# Patient Record
Sex: Male | Born: 1960 | Race: White | Hispanic: No | Marital: Married | Smoking: Never smoker
Health system: Southern US, Community
[De-identification: ages and names within clinical notes are randomized; demographics above are authoritative.]

## PROBLEM LIST (undated history)

## (undated) DIAGNOSIS — J45909 Unspecified asthma, uncomplicated: Secondary | ICD-10-CM

## (undated) DIAGNOSIS — E559 Vitamin D deficiency, unspecified: Secondary | ICD-10-CM

## (undated) DIAGNOSIS — D049 Carcinoma in situ of skin, unspecified: Secondary | ICD-10-CM

## (undated) DIAGNOSIS — K649 Unspecified hemorrhoids: Secondary | ICD-10-CM

## (undated) DIAGNOSIS — E785 Hyperlipidemia, unspecified: Secondary | ICD-10-CM

## (undated) DIAGNOSIS — J301 Allergic rhinitis due to pollen: Secondary | ICD-10-CM

## (undated) DIAGNOSIS — M7740 Metatarsalgia, unspecified foot: Secondary | ICD-10-CM

## (undated) HISTORY — DX: Metatarsalgia, unspecified foot: M77.40

## (undated) HISTORY — DX: Unspecified hemorrhoids: K64.9

## (undated) HISTORY — PX: MOHS SURGERY: SHX181

## (undated) HISTORY — DX: Hyperlipidemia, unspecified: E78.5

## (undated) HISTORY — DX: Vitamin D deficiency, unspecified: E55.9

## (undated) HISTORY — DX: Allergic rhinitis due to pollen: J30.1

## (undated) HISTORY — DX: Carcinoma in situ of skin, unspecified: D04.9

## (undated) HISTORY — PX: HEMORRHOID BANDING: SHX5850

## (undated) HISTORY — DX: Unspecified asthma, uncomplicated: J45.909

---

## 2016-05-05 DIAGNOSIS — Z205 Contact with and (suspected) exposure to viral hepatitis: Secondary | ICD-10-CM | POA: Diagnosis not present

## 2016-05-18 DIAGNOSIS — J343 Hypertrophy of nasal turbinates: Secondary | ICD-10-CM | POA: Diagnosis not present

## 2016-05-18 DIAGNOSIS — R0683 Snoring: Secondary | ICD-10-CM | POA: Diagnosis not present

## 2016-05-18 DIAGNOSIS — J342 Deviated nasal septum: Secondary | ICD-10-CM | POA: Diagnosis not present

## 2016-05-30 DIAGNOSIS — J342 Deviated nasal septum: Secondary | ICD-10-CM | POA: Diagnosis not present

## 2016-05-30 DIAGNOSIS — J343 Hypertrophy of nasal turbinates: Secondary | ICD-10-CM | POA: Diagnosis not present

## 2016-07-15 DIAGNOSIS — M722 Plantar fascial fibromatosis: Secondary | ICD-10-CM | POA: Diagnosis not present

## 2016-09-19 DIAGNOSIS — M722 Plantar fascial fibromatosis: Secondary | ICD-10-CM | POA: Diagnosis not present

## 2017-05-25 ENCOUNTER — Ambulatory Visit (INDEPENDENT_AMBULATORY_CARE_PROVIDER_SITE_OTHER): Payer: BLUE CROSS/BLUE SHIELD | Admitting: Family Medicine

## 2017-05-25 ENCOUNTER — Ambulatory Visit (INDEPENDENT_AMBULATORY_CARE_PROVIDER_SITE_OTHER): Payer: BLUE CROSS/BLUE SHIELD

## 2017-05-25 ENCOUNTER — Encounter: Payer: Self-pay | Admitting: Family Medicine

## 2017-05-25 VITALS — BP 125/78 | HR 65 | Temp 97.3°F | Resp 16 | Ht 72.0 in | Wt 259.2 lb

## 2017-05-25 DIAGNOSIS — M25512 Pain in left shoulder: Secondary | ICD-10-CM | POA: Diagnosis not present

## 2017-05-25 DIAGNOSIS — S46912A Strain of unspecified muscle, fascia and tendon at shoulder and upper arm level, left arm, initial encounter: Secondary | ICD-10-CM | POA: Diagnosis not present

## 2017-05-25 DIAGNOSIS — R079 Chest pain, unspecified: Secondary | ICD-10-CM | POA: Diagnosis not present

## 2017-05-25 MED ORDER — METHOCARBAMOL 500 MG PO TABS: 500.0000 mg | ORAL_TABLET | Freq: Four times a day (QID) | ORAL | 0 refills | Status: DC

## 2017-05-25 MED ORDER — PREDNISONE 20 MG PO TABS: ORAL_TABLET | ORAL | 0 refills | Status: DC

## 2017-05-25 MED ORDER — DICLOFENAC SODIUM 75 MG PO TBEC: 75.0000 mg | DELAYED_RELEASE_TABLET | Freq: Two times a day (BID) | ORAL | 0 refills | Status: DC

## 2017-05-25 MED ORDER — HYDROCODONE-ACETAMINOPHEN 5-325 MG PO TABS: 1.0000 | ORAL_TABLET | Freq: Four times a day (QID) | ORAL | 0 refills | Status: DC | PRN

## 2017-05-25 NOTE — Patient Instructions (Addendum)
Take prednisone 20 mg 3 pills daily for 2 days, then 2 for 2 days, then 1 for 2 days  Take diclofenac 75 mg one twice daily for pain and inflammation. This is a strong medicine in the class of ibuprofen and Aleve so you should not be taking any of them while you are on this medicine.  Take Norco 5 (hydrocodone) one every 6 hours if needed for worse pain, for short-term use only, not to be taken when driving distances.  Do gentle stretching exercises  If the pain continues to persist we may end up wanting to refer you over to a sports medicine doctor.    IF you received an x-ray today, you will receive an invoice from [facility]. Please contact [facility] at [phone] with questions or concerns regarding your invoice.   IF you received labwork today, you will receive an invoice from [facility]. Please contact [facility] at [phone] with questions or concerns regarding your invoice.   Our billing staff will not be able to assist you with questions regarding bills from these companies.  You will be contacted with the lab results as soon as they are available. The fastest way to get your results is to activate your My Chart account. Instructions are located on the last page of this paperwork. If you have not heard from [place] regarding the results in 2 weeks, please contact this office.

## 2017-05-25 NOTE — Progress Notes (Signed)
Patient ID: David Fisher, male    DOB: Feb 14, 1961  Age: 56 y.o. MRN: [id number]  Chief Complaint  Patient presents with  . Neck Pain    left side of shoulder/back radiating down left arm, sharp/intense pain pt has never felt before/  Taking a dozen aleve a day with no relief.    Subjective:   Patient has been playing more golf lately, and over the last couple weeks he has had bad pain in his left shoulder and upper arm. It started when he was golfing. He has taken a lot of OTC nSAIDS, up to a doesn't Aleve a day, without relief. No chest pain or shortness of breath. It is very positional, and if he keeps his left hand up above and behind his head it seems to give it relief. No left hand weakness. He has continued to play golf despite this pain, the swinging of the globe doesn't seem to hurt her a lot. Current allergies, medications, problem list, past/family and social histories reviewed.  Objective:  BP 125/78 (BP Location: Right Arm, Patient Position: Sitting, Cuff Size: Large)   Pulse 65   Temp (!) 97.3 F (36.3 C) (Oral)   Resp 16   Ht 6' (1.829 m)   Wt 259 lb 3.2 oz (117.6 kg)   SpO2 98%   BMI 35.15 kg/m   Neck has good range of motion and is supple and nontender. Left shoulder is nontender. Left upper arm is nontender. Left chest wall is nontender. He is tender just medial and deep to the left scapula.  Assessment & Plan:   Assessment: 1. Left shoulder pain, unspecified chronicity       Plan: Strain scapula area.  Orders Placed This Encounter  Procedures  . DG Chest 2 View    Standing Status:   Future    Number of Occurrences:   1    Standing Expiration Date:   05/25/2018    Order Specific Question:   Reason for Exam (SYMPTOM  OR DIAGNOSIS REQUIRED)    Answer:   left shoulder and arm pain    Order Specific Question:   Preferred imaging location?    Answer:   External  . EKG 12-Lead   EKG is normal. Chest x-ray normal. Meds ordered this encounter  Medications  .  simvastatin (ZOCOR) 10 MG tablet    Sig: Take 10 mg by mouth daily.  Marland Kitchen aspirin EC 81 MG tablet    Sig: Take 81 mg by mouth daily.  . methocarbamol (ROBAXIN) 500 MG tablet    Sig: Take 1 tablet (500 mg total) by mouth 4 (four) times daily.    Dispense:  40 tablet    Refill:  0  . diclofenac (VOLTAREN) 75 MG EC tablet    Sig: Take 1 tablet (75 mg total) by mouth 2 (two) times daily.    Dispense:  30 tablet    Refill:  0  . predniSONE (DELTASONE) 20 MG tablet    Sig: Take 3 daily for 2 days, then 2 daily for 2 days, then 1 daily for 2 days    Dispense:  12 tablet    Refill:  0  . HYDROcodone-acetaminophen (NORCO) 5-325 MG tablet    Sig: Take 1 tablet by mouth every 6 (six) hours as needed.    Dispense:  12 tablet    Refill:  0         Patient Instructions   Take prednisone 20 mg 3 pills daily for 2 days,  then 2 for 2 days, then 1 for 2 days  Take diclofenac 75 mg one twice daily for pain and inflammation. This is a strong medicine in the class of ibuprofen and Aleve so you should not be taking any of them while you are on this medicine.  Take Norco 5 (hydrocodone) one every 6 hours if needed for worse pain, for short-term use only, not to be taken when driving distances.  Do gentle stretching exercises  If the pain continues to persist we may end up wanting to refer you over to a sports medicine doctor.    IF you received an x-ray today, you will receive an invoice from [facility]. Please contact [facility] at [phone] with questions or concerns regarding your invoice.   IF you received labwork today, you will receive an invoice from [facility]. Please contact [facility] at [phone] with questions or concerns regarding your invoice.   Our billing staff will not be able to assist you with questions regarding bills from these companies.  You will be contacted with the lab results as soon as they are available. The fastest way to get your results is to  activate your My Chart account. Instructions are located on the last page of this paperwork. If you have not heard from [place] regarding the results in 2 weeks, please contact this office.      If he calls back for referral to sports medicine please make referral to Vickki Hearing M.D. or Rhina Brackett M.D.  Return if symptoms worsen or fail to improve.   Irmgard Rampersaud, MD 05/25/2017

## 2017-05-29 ENCOUNTER — Other Ambulatory Visit: Payer: Self-pay | Admitting: *Deleted

## 2017-05-29 DIAGNOSIS — M25511 Pain in right shoulder: Secondary | ICD-10-CM

## 2017-05-29 MED ORDER — METAXALONE 800 MG PO TABS: 800.0000 mg | ORAL_TABLET | Freq: Three times a day (TID) | ORAL | 0 refills | Status: DC

## 2017-06-12 ENCOUNTER — Other Ambulatory Visit: Payer: Self-pay | Admitting: Family Medicine

## 2017-06-12 DIAGNOSIS — M4003 Postural kyphosis, cervicothoracic region: Secondary | ICD-10-CM | POA: Diagnosis not present

## 2017-06-12 DIAGNOSIS — M9902 Segmental and somatic dysfunction of thoracic region: Secondary | ICD-10-CM | POA: Diagnosis not present

## 2017-06-12 DIAGNOSIS — M9901 Segmental and somatic dysfunction of cervical region: Secondary | ICD-10-CM | POA: Diagnosis not present

## 2017-06-12 DIAGNOSIS — M50322 Other cervical disc degeneration at C5-C6 level: Secondary | ICD-10-CM | POA: Diagnosis not present

## 2017-06-12 DIAGNOSIS — M25511 Pain in right shoulder: Secondary | ICD-10-CM

## 2017-06-12 DIAGNOSIS — M25512 Pain in left shoulder: Secondary | ICD-10-CM

## 2017-06-12 NOTE — Telephone Encounter (Signed)
Please notify that I have refilled the requested prescriptions. If his symptoms persist, he needs re-evaluation, and/or referral to Sports Medicine (see Dr. Clayborn Heron note).  Meds ordered this encounter  Medications  . diclofenac (VOLTAREN) 75 MG EC tablet    Sig: TAKE 1 TABLET BY MOUTH TWICE A DAY    Dispense:  30 tablet    Refill:  0  . metaxalone (SKELAXIN) 800 MG tablet    Sig: TAKE 1 TABLET BY MOUTH THREE TIMES A DAY    Dispense:  20 tablet    Refill:  0

## 2017-06-14 DIAGNOSIS — M5386 Other specified dorsopathies, lumbar region: Secondary | ICD-10-CM | POA: Diagnosis not present

## 2017-06-14 DIAGNOSIS — M9902 Segmental and somatic dysfunction of thoracic region: Secondary | ICD-10-CM | POA: Diagnosis not present

## 2017-06-14 DIAGNOSIS — M9903 Segmental and somatic dysfunction of lumbar region: Secondary | ICD-10-CM | POA: Diagnosis not present

## 2017-06-14 DIAGNOSIS — M9901 Segmental and somatic dysfunction of cervical region: Secondary | ICD-10-CM | POA: Diagnosis not present

## 2017-06-14 DIAGNOSIS — M50322 Other cervical disc degeneration at C5-C6 level: Secondary | ICD-10-CM | POA: Diagnosis not present

## 2017-06-14 DIAGNOSIS — M4003 Postural kyphosis, cervicothoracic region: Secondary | ICD-10-CM | POA: Diagnosis not present

## 2017-06-15 DIAGNOSIS — M9901 Segmental and somatic dysfunction of cervical region: Secondary | ICD-10-CM | POA: Diagnosis not present

## 2017-06-15 DIAGNOSIS — M5386 Other specified dorsopathies, lumbar region: Secondary | ICD-10-CM | POA: Diagnosis not present

## 2017-06-15 DIAGNOSIS — M50322 Other cervical disc degeneration at C5-C6 level: Secondary | ICD-10-CM | POA: Diagnosis not present

## 2017-06-15 DIAGNOSIS — M9903 Segmental and somatic dysfunction of lumbar region: Secondary | ICD-10-CM | POA: Diagnosis not present

## 2017-06-20 DIAGNOSIS — M9901 Segmental and somatic dysfunction of cervical region: Secondary | ICD-10-CM | POA: Diagnosis not present

## 2017-06-20 DIAGNOSIS — M50322 Other cervical disc degeneration at C5-C6 level: Secondary | ICD-10-CM | POA: Diagnosis not present

## 2017-06-20 DIAGNOSIS — M9903 Segmental and somatic dysfunction of lumbar region: Secondary | ICD-10-CM | POA: Diagnosis not present

## 2017-06-20 DIAGNOSIS — M5386 Other specified dorsopathies, lumbar region: Secondary | ICD-10-CM | POA: Diagnosis not present

## 2017-06-21 DIAGNOSIS — M9903 Segmental and somatic dysfunction of lumbar region: Secondary | ICD-10-CM | POA: Diagnosis not present

## 2017-06-21 DIAGNOSIS — M50322 Other cervical disc degeneration at C5-C6 level: Secondary | ICD-10-CM | POA: Diagnosis not present

## 2017-06-21 DIAGNOSIS — M5386 Other specified dorsopathies, lumbar region: Secondary | ICD-10-CM | POA: Diagnosis not present

## 2017-06-21 DIAGNOSIS — M9901 Segmental and somatic dysfunction of cervical region: Secondary | ICD-10-CM | POA: Diagnosis not present

## 2017-06-27 DIAGNOSIS — M50322 Other cervical disc degeneration at C5-C6 level: Secondary | ICD-10-CM | POA: Diagnosis not present

## 2017-06-27 DIAGNOSIS — M9903 Segmental and somatic dysfunction of lumbar region: Secondary | ICD-10-CM | POA: Diagnosis not present

## 2017-06-27 DIAGNOSIS — M9901 Segmental and somatic dysfunction of cervical region: Secondary | ICD-10-CM | POA: Diagnosis not present

## 2017-06-27 DIAGNOSIS — M5386 Other specified dorsopathies, lumbar region: Secondary | ICD-10-CM | POA: Diagnosis not present

## 2017-06-29 DIAGNOSIS — M5386 Other specified dorsopathies, lumbar region: Secondary | ICD-10-CM | POA: Diagnosis not present

## 2017-06-29 DIAGNOSIS — M9901 Segmental and somatic dysfunction of cervical region: Secondary | ICD-10-CM | POA: Diagnosis not present

## 2017-06-29 DIAGNOSIS — M9903 Segmental and somatic dysfunction of lumbar region: Secondary | ICD-10-CM | POA: Diagnosis not present

## 2017-06-29 DIAGNOSIS — M50322 Other cervical disc degeneration at C5-C6 level: Secondary | ICD-10-CM | POA: Diagnosis not present

## 2017-07-03 DIAGNOSIS — M9903 Segmental and somatic dysfunction of lumbar region: Secondary | ICD-10-CM | POA: Diagnosis not present

## 2017-07-03 DIAGNOSIS — M50322 Other cervical disc degeneration at C5-C6 level: Secondary | ICD-10-CM | POA: Diagnosis not present

## 2017-07-03 DIAGNOSIS — M9901 Segmental and somatic dysfunction of cervical region: Secondary | ICD-10-CM | POA: Diagnosis not present

## 2017-07-03 DIAGNOSIS — M5386 Other specified dorsopathies, lumbar region: Secondary | ICD-10-CM | POA: Diagnosis not present

## 2017-07-05 DIAGNOSIS — M9901 Segmental and somatic dysfunction of cervical region: Secondary | ICD-10-CM | POA: Diagnosis not present

## 2017-07-05 DIAGNOSIS — M50322 Other cervical disc degeneration at C5-C6 level: Secondary | ICD-10-CM | POA: Diagnosis not present

## 2017-07-05 DIAGNOSIS — M9903 Segmental and somatic dysfunction of lumbar region: Secondary | ICD-10-CM | POA: Diagnosis not present

## 2017-07-05 DIAGNOSIS — M5386 Other specified dorsopathies, lumbar region: Secondary | ICD-10-CM | POA: Diagnosis not present

## 2017-07-17 DIAGNOSIS — M9903 Segmental and somatic dysfunction of lumbar region: Secondary | ICD-10-CM | POA: Diagnosis not present

## 2017-07-17 DIAGNOSIS — M50322 Other cervical disc degeneration at C5-C6 level: Secondary | ICD-10-CM | POA: Diagnosis not present

## 2017-07-17 DIAGNOSIS — M9901 Segmental and somatic dysfunction of cervical region: Secondary | ICD-10-CM | POA: Diagnosis not present

## 2017-07-17 DIAGNOSIS — M5386 Other specified dorsopathies, lumbar region: Secondary | ICD-10-CM | POA: Diagnosis not present

## 2017-07-19 DIAGNOSIS — M9901 Segmental and somatic dysfunction of cervical region: Secondary | ICD-10-CM | POA: Diagnosis not present

## 2017-07-19 DIAGNOSIS — M50322 Other cervical disc degeneration at C5-C6 level: Secondary | ICD-10-CM | POA: Diagnosis not present

## 2017-07-19 DIAGNOSIS — M9903 Segmental and somatic dysfunction of lumbar region: Secondary | ICD-10-CM | POA: Diagnosis not present

## 2017-07-19 DIAGNOSIS — M5386 Other specified dorsopathies, lumbar region: Secondary | ICD-10-CM | POA: Diagnosis not present

## 2017-07-24 DIAGNOSIS — M50322 Other cervical disc degeneration at C5-C6 level: Secondary | ICD-10-CM | POA: Diagnosis not present

## 2017-07-24 DIAGNOSIS — M9903 Segmental and somatic dysfunction of lumbar region: Secondary | ICD-10-CM | POA: Diagnosis not present

## 2017-07-24 DIAGNOSIS — M5386 Other specified dorsopathies, lumbar region: Secondary | ICD-10-CM | POA: Diagnosis not present

## 2017-07-24 DIAGNOSIS — M9901 Segmental and somatic dysfunction of cervical region: Secondary | ICD-10-CM | POA: Diagnosis not present

## 2017-08-03 DIAGNOSIS — M9903 Segmental and somatic dysfunction of lumbar region: Secondary | ICD-10-CM | POA: Diagnosis not present

## 2017-08-03 DIAGNOSIS — M5386 Other specified dorsopathies, lumbar region: Secondary | ICD-10-CM | POA: Diagnosis not present

## 2017-08-03 DIAGNOSIS — M50322 Other cervical disc degeneration at C5-C6 level: Secondary | ICD-10-CM | POA: Diagnosis not present

## 2017-08-03 DIAGNOSIS — M9901 Segmental and somatic dysfunction of cervical region: Secondary | ICD-10-CM | POA: Diagnosis not present

## 2017-08-10 DIAGNOSIS — M50322 Other cervical disc degeneration at C5-C6 level: Secondary | ICD-10-CM | POA: Diagnosis not present

## 2017-08-10 DIAGNOSIS — M5386 Other specified dorsopathies, lumbar region: Secondary | ICD-10-CM | POA: Diagnosis not present

## 2017-08-10 DIAGNOSIS — M9903 Segmental and somatic dysfunction of lumbar region: Secondary | ICD-10-CM | POA: Diagnosis not present

## 2017-08-10 DIAGNOSIS — M9901 Segmental and somatic dysfunction of cervical region: Secondary | ICD-10-CM | POA: Diagnosis not present

## 2017-08-14 DIAGNOSIS — M50322 Other cervical disc degeneration at C5-C6 level: Secondary | ICD-10-CM | POA: Diagnosis not present

## 2017-08-14 DIAGNOSIS — M5386 Other specified dorsopathies, lumbar region: Secondary | ICD-10-CM | POA: Diagnosis not present

## 2017-08-14 DIAGNOSIS — M9901 Segmental and somatic dysfunction of cervical region: Secondary | ICD-10-CM | POA: Diagnosis not present

## 2017-08-14 DIAGNOSIS — M9903 Segmental and somatic dysfunction of lumbar region: Secondary | ICD-10-CM | POA: Diagnosis not present

## 2018-01-17 DIAGNOSIS — C44619 Basal cell carcinoma of skin of left upper limb, including shoulder: Secondary | ICD-10-CM | POA: Diagnosis not present

## 2018-01-17 DIAGNOSIS — L814 Other melanin hyperpigmentation: Secondary | ICD-10-CM | POA: Diagnosis not present

## 2018-01-17 DIAGNOSIS — D1801 Hemangioma of skin and subcutaneous tissue: Secondary | ICD-10-CM | POA: Diagnosis not present

## 2018-01-17 DIAGNOSIS — D485 Neoplasm of uncertain behavior of skin: Secondary | ICD-10-CM | POA: Diagnosis not present

## 2018-01-17 DIAGNOSIS — C4441 Basal cell carcinoma of skin of scalp and neck: Secondary | ICD-10-CM | POA: Diagnosis not present

## 2018-01-17 DIAGNOSIS — D229 Melanocytic nevi, unspecified: Secondary | ICD-10-CM | POA: Diagnosis not present

## 2018-01-17 DIAGNOSIS — L821 Other seborrheic keratosis: Secondary | ICD-10-CM | POA: Diagnosis not present

## 2018-01-17 DIAGNOSIS — C44311 Basal cell carcinoma of skin of nose: Secondary | ICD-10-CM | POA: Diagnosis not present

## 2018-03-01 DIAGNOSIS — C44519 Basal cell carcinoma of skin of other part of trunk: Secondary | ICD-10-CM | POA: Diagnosis not present

## 2018-03-01 DIAGNOSIS — C44619 Basal cell carcinoma of skin of left upper limb, including shoulder: Secondary | ICD-10-CM | POA: Diagnosis not present

## 2018-03-01 DIAGNOSIS — C4441 Basal cell carcinoma of skin of scalp and neck: Secondary | ICD-10-CM | POA: Diagnosis not present

## 2018-03-01 DIAGNOSIS — L905 Scar conditions and fibrosis of skin: Secondary | ICD-10-CM | POA: Diagnosis not present

## 2018-05-02 DIAGNOSIS — C44311 Basal cell carcinoma of skin of nose: Secondary | ICD-10-CM | POA: Diagnosis not present

## 2018-06-01 ENCOUNTER — Encounter: Payer: Self-pay | Admitting: Gastroenterology

## 2018-07-23 ENCOUNTER — Encounter (INDEPENDENT_AMBULATORY_CARE_PROVIDER_SITE_OTHER): Payer: Self-pay

## 2018-07-23 ENCOUNTER — Ambulatory Visit: Payer: BLUE CROSS/BLUE SHIELD | Admitting: Gastroenterology

## 2018-07-23 ENCOUNTER — Encounter: Payer: Self-pay | Admitting: Gastroenterology

## 2018-07-23 VITALS — BP 142/82 | HR 72 | Ht 72.0 in | Wt 263.2 lb

## 2018-07-23 DIAGNOSIS — Z1211 Encounter for screening for malignant neoplasm of colon: Secondary | ICD-10-CM | POA: Diagnosis not present

## 2018-07-23 DIAGNOSIS — K625 Hemorrhage of anus and rectum: Secondary | ICD-10-CM | POA: Diagnosis not present

## 2018-07-23 MED ORDER — PEG-KCL-NACL-NASULF-NA ASC-C 140 G PO SOLR: 140.0000 g | ORAL | 0 refills | Status: DC

## 2018-07-23 NOTE — Patient Instructions (Signed)
If you are age 58 or older, your body mass index should be between 23-30. Your Body mass index is 35.7 kg/m. If this is out of the aforementioned range listed, please consider follow up with your Primary Care Provider.  If you are age 61 or younger, your body mass index should be between 19-25. Your Body mass index is 35.7 kg/m. If this is out of the aformentioned range listed, please consider follow up with your Primary Care Provider.   You have been scheduled for a colonoscopy. Please follow written instructions given to you at your visit today.  Please pick up your prep supplies at the pharmacy within the next 1-3 days. If you use inhalers (even only as needed), please bring them with you on the day of your procedure. Your physician has requested that you go to www.startemmi.com and enter the access code given to you at your visit today. This web site gives a general overview about your procedure. However, you should still follow specific instructions given to you by our office regarding your preparation for the procedure.  It was a pleasure to see you today!  Dr. Loletha Carrow

## 2018-07-23 NOTE — Progress Notes (Signed)
[facility] Consult Note:  History: David Fisher 07/23/2018  Referring physician: Waldemar Dickens, MD  Reason for consult/chief complaint: Discuss Colon ([place] colon [place] 8-[place]); Hemorrhoids (flare intermittently. ); and Rectal Bleeding (during hemorrhoid flare)   Subjective  HPI:  He reports a colonoscopy done for rectal bleeding 10 years ago while living and working in [place], and recalls that internal hemorrhoids were found.  He has had intermittent painless bleeding attributed to these hemorrhoids since then, occurring every few weeks to a couple of months.  He denies chronic abdominal pain, change in bowel habits, nausea, vomiting, frequent heartburn, dysphagia or weight loss.  He recalls having had one in the hemorrhoids "cauterized" many years ago, no surgical therapy or banding has been done.   ROS:  Review of Systems  Constitutional: Negative for appetite change and unexpected weight change.  HENT: Negative for mouth sores and voice change.   Eyes: Negative for pain and redness.  Respiratory: Negative for cough and shortness of breath.   Cardiovascular: Negative for chest pain and palpitations.  Genitourinary: Negative for dysuria and hematuria.  Musculoskeletal: Negative for arthralgias and myalgias.  Skin: Negative for pallor and rash.  Allergic/Immunologic: Positive for environmental allergies.  Neurological: Negative for weakness and headaches.  Hematological: Negative for adenopathy.     Past Medical History: Past Medical History:  Diagnosis Date  . Asthma   . Basal cell carcinoma (BCC) in situ of skin 2013,2019   nose  . Hay fever   . Hemorrhoids   . Hyperlipidemia   . Metatarsalgia   . Vitamin D deficiency      Past Surgical History: Past Surgical History:  Procedure Laterality Date  . MOHS SURGERY     BCC Nose     Family History: Family History  Problem Relation Age of Onset  . Skin cancer Father      Social History: Social History   Socioeconomic History  . Marital status: Married    Spouse name: Not on file  . Number of children: 3  . Years of education: Not on file  . Highest education level: Not on file  Occupational History  . Not on file  Social Needs  . Financial resource strain: Not on file  . Food insecurity:    Worry: Not on file    Inability: Not on file  . Transportation needs:    Medical: Not on file    Non-medical: Not on file  Tobacco Use  . Smoking status: Never Smoker  . Smokeless tobacco: Never Used  Substance and Sexual Activity  . Alcohol use: Yes    Alcohol/week: 2.0 standard drinks    Types: 1 Glasses of wine, 1 Cans of beer per week    Comment: social  . Drug use: No  . Sexual activity: Yes    Partners: Female  Lifestyle  . Physical activity:    Days per week: Not on file    Minutes per session: Not on file  . Stress: Not on file  Relationships  . Social connections:    Talks on phone: Not on file    Gets together: Not on file    Attends religious service: Not on file    Active member of club or organization: Not on file    Attends meetings of clubs or organizations: Not on file    Relationship status: Not on file  Other Topics Concern  . Not on file  Social History Narrative  . 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 aspirin EC 81 MG tablet Take 81 mg by mouth daily.    . cetirizine (ZYRTEC) 10 MG tablet Take 10 mg by mouth daily.    . Multiple Vitamin (MULTIVITAMIN) tablet Take 1 tablet by mouth daily.    . simvastatin (ZOCOR) 10 MG tablet Take 10 mg by mouth daily.    Marland Kitchen PEG-KCl-NaCl-NaSulf-Na Asc-C (PLENVU) 140 g SOLR Take 140 g by mouth as directed. 1 each 0   No current facility-administered medications for this visit.        ___________________________________________________________________ Objective   Exam:  BP (!) 142/82   Pulse 72   Ht 6' (1.829 m)   Wt 263 lb 4 oz (119.4 kg)   BMI 35.70 kg/m    General: this is a(n) well-appearing man  Eyes: sclera anicteric, no redness  ENT: oral mucosa moist without lesions, no cervical or supraclavicular lymphadenopathy, good dentition  CV: RRR without murmur, S1/S2, no JVD, no peripheral edema  Resp: clear to auscultation bilaterally, normal RR and effort noted  GI: soft, no tenderness, with active bowel sounds. No guarding or palpable organomegaly noted.  Skin; warm and dry, no rash or jaundice noted  Neuro: awake, alert and oriented x 3. Normal gross motor function and fluent speech  Labs: Primary care records include normal creatinine and hemoglobin on 05/08/2018  Assessment: Encounter Diagnoses  Name Primary?  . Special screening for malignant neoplasms, colon Yes  . Rectal bleeding     He is due for screening colonoscopy.  His hemorrhoidal bleeding seems to need treatment.  We had an initial discussion about hemorrhoidal banding, I showed him diagrams and informational booklet.  He is interested, and we set him up for a follow-up office visit after the colonoscopy for probable banding session.   Thank you for the courtesy of this consult.  Please call me with any questions or concerns.  Nelida Meuse III  CC: Waldemar Dickens, MD

## 2018-08-17 HISTORY — PX: COLONOSCOPY: SHX174

## 2018-09-03 ENCOUNTER — Other Ambulatory Visit: Payer: Self-pay

## 2018-09-03 ENCOUNTER — Telehealth: Payer: Self-pay | Admitting: Gastroenterology

## 2018-09-03 MED ORDER — PEG-KCL-NACL-NASULF-NA ASC-C 140 G PO SOLR: 140.0000 g | ORAL | 0 refills | Status: DC

## 2018-09-03 NOTE — Telephone Encounter (Signed)
A pay no more than $ 50 coupon has been faxed to the patients pharmacy.

## 2018-09-05 ENCOUNTER — Encounter: Payer: Self-pay | Admitting: Gastroenterology

## 2018-09-05 ENCOUNTER — Encounter: Payer: BLUE CROSS/BLUE SHIELD | Admitting: Gastroenterology

## 2018-09-05 ENCOUNTER — Ambulatory Visit (AMBULATORY_SURGERY_CENTER): Payer: BLUE CROSS/BLUE SHIELD | Admitting: Gastroenterology

## 2018-09-05 VITALS — BP 132/69 | HR 56 | Temp 97.8°F | Resp 13 | Ht 72.0 in | Wt 263.0 lb

## 2018-09-05 DIAGNOSIS — D125 Benign neoplasm of sigmoid colon: Secondary | ICD-10-CM | POA: Diagnosis not present

## 2018-09-05 DIAGNOSIS — Z1211 Encounter for screening for malignant neoplasm of colon: Secondary | ICD-10-CM

## 2018-09-05 DIAGNOSIS — D123 Benign neoplasm of transverse colon: Secondary | ICD-10-CM | POA: Diagnosis not present

## 2018-09-05 MED ORDER — SODIUM CHLORIDE 0.9 % IV SOLN: 500.0000 mL | Freq: Once | INTRAVENOUS | Status: DC

## 2018-09-05 NOTE — Progress Notes (Signed)
Called to room to assist during endoscopic procedure.  Patient ID and intended procedure confirmed with present staff. Received instructions for my participation in the procedure from the performing physician.  

## 2018-09-05 NOTE — Patient Instructions (Signed)
Handouts:  Polyps, and banding info.  YOU HAD AN ENDOSCOPIC PROCEDURE TODAY AT [facility]:   Refer to the procedure report that was given to you for any specific questions about what was found during the examination.  If the procedure report does not answer your questions, please call your gastroenterologist to clarify.  If you requested that your care partner not be given the details of your procedure findings, then the procedure report has been included in a sealed envelope for you to review at your convenience later.  YOU SHOULD EXPECT: Some feelings of bloating in the abdomen. Passage of more gas than usual.  Walking can help get rid of the air that was put into your GI tract during the procedure and reduce the bloating. If you had a lower endoscopy (such as a colonoscopy or flexible sigmoidoscopy) you may notice spotting of blood in your stool or on the toilet paper. If you underwent a bowel prep for your procedure, you may not have a normal bowel movement for a few days.  Please Note:  You might notice some irritation and congestion in your nose or some drainage.  This is from the oxygen used during your procedure.  There is no need for concern and it should clear up in a day or so.  SYMPTOMS TO REPORT IMMEDIATELY:   Following lower endoscopy (colonoscopy or flexible sigmoidoscopy):  Excessive amounts of blood in the stool  Significant tenderness or worsening of abdominal pains  Swelling of the abdomen that is new, acute  Fever of 100F or higher  For urgent or emergent issues, a gastroenterologist can be reached at any hour by calling [phone].   DIET:  We do recommend a small meal at first, but then you may proceed to your regular diet.  Drink plenty of fluids but you should avoid alcoholic beverages for 24 hours.  ACTIVITY:  You should plan to take it easy for the rest of today and you should NOT DRIVE or use heavy machinery until tomorrow (because of the  sedation medicines used during the test).    FOLLOW UP: Our staff will call the number listed on your records the next business day following your procedure to check on you and address any questions or concerns that you may have regarding the information given to you following your procedure. If we do not reach you, we will leave a message.  However, if you are feeling well and you are not experiencing any problems, there is no need to return our call.  We will assume that you have returned to your regular daily activities without incident.  If any biopsies were taken you will be contacted by phone or by letter within the next 1-3 weeks.  Please call us at [phone] if you have not heard about the biopsies in 3 weeks.    SIGNATURES/CONFIDENTIALITY: You and/or your care partner have signed paperwork which will be entered into your electronic medical record.  These signatures attest to the fact that that the information above on your After Visit Summary has been reviewed and is understood.  Full responsibility of the confidentiality of this discharge information lies with you and/or your care-partner.

## 2018-09-05 NOTE — Op Note (Addendum)
[place] Patient Name: David Fisher Procedure Date: 09/05/2018 1:33 PM MRN: [id number] Endoscopist: Mallie Mussel L. Loletha Carrow , MD Age: 57 Referring MD:  Date of Birth: 07-10-1961 Gender: Male Account #: [id number] Procedure:                Colonoscopy Indications:              Screening for colorectal malignant neoplasm, , This                            is the patient's first screening colonoscopy                            (patient reports a diagnostic colonoscopy 10 years                            ago done for rectal bleeding) Medicines:                Monitored Anesthesia Care Procedure:                Pre-Anesthesia Assessment:                           - Prior to the procedure, a History and Physical                            was performed, and patient medications and                            allergies were reviewed. The patient's tolerance of                            previous anesthesia was also reviewed. The risks                            and benefits of the procedure and the sedation                            options and risks were discussed with the patient.                            All questions were answered, and informed consent                            was obtained. Anticoagulants: The patient has taken                            aspirin. It was decided not to withhold this                            medication prior to the procedure. ASA Grade                            Assessment: II - A patient with mild systemic  disease. After reviewing the risks and benefits,                            the patient was deemed in satisfactory condition to                            undergo the procedure.                           After obtaining informed consent, the colonoscope                            was passed under direct vision. Throughout the                            procedure, the patient's blood pressure, pulse, and                oxygen saturations were monitored continuously. The                            Colonoscope was introduced through the anus and                            advanced to the the cecum, identified by                            appendiceal orifice and ileocecal valve. The                            colonoscopy was performed without difficulty. The                            patient tolerated the procedure well. The quality                            of the bowel preparation was good. The ileocecal                            valve, appendiceal orifice, and rectum were                            photographed. Scope In: 1:42:08 PM Scope Out: 1:59:48 PM Scope Withdrawal Time: 0 hours 13 minutes 0 seconds  Total Procedure Duration: 0 hours 17 minutes 40 seconds  Findings:                 The perianal and digital rectal examinations were                            normal.                           A 6 mm polyp was found in the transverse colon. The  polyp was sessile. The polyp was removed with a                            cold snare. Resection and retrieval were complete.                           A 8 mm polyp was found in the distal sigmoid colon.                            The polyp was semi-pedunculated. The polyp was                            removed with a hot snare. Resection and retrieval                            were complete.                           Internal hemorrhoids were found.                           The exam was otherwise without abnormality on                            direct and retroflexion views. Complications:            No immediate complications. Estimated Blood Loss:     Estimated blood loss was minimal. Impression:               - One 6 mm polyp in the transverse colon, removed                            with a cold snare. Resected and retrieved.                           - One 8 mm polyp in the distal sigmoid colon,                             removed with a hot snare. Resected and retrieved.                           - Internal hemorrhoids.                           - The examination was otherwise normal on direct                            and retroflexion views. Recommendation:           - Patient has a contact number available for                            emergencies. The signs and symptoms of potential  delayed complications were discussed with the                            patient. Return to normal activities tomorrow.                            Written discharge instructions were provided to the                            patient.                           - Resume previous diet.                           - Continue present medications.                           - Await pathology results.                           - Repeat colonoscopy is recommended for                            surveillance. The colonoscopy date will be                            determined after pathology results from today's                            exam become available for review.                           - Return to my office as needed if hemorrhoidal                            banding desired. [name] L. Loletha Carrow, MD 09/05/2018 2:08:12 PM This report has been signed electronically.

## 2018-09-05 NOTE — Progress Notes (Signed)
PT taken to PACU. Monitors in place. VSS. Report given to RN. 

## 2018-09-06 ENCOUNTER — Telehealth: Payer: Self-pay | Admitting: *Deleted

## 2018-09-06 NOTE — Telephone Encounter (Signed)
  Follow up Call-  Call back number 09/05/2018  Post procedure Call Back phone  # [phone]  Permission to leave phone message Yes     Patient questions:  Do you have a fever, pain , or abdominal swelling? No. Pain Score  0 *  Have you tolerated food without any problems? Yes.    Have you been able to return to your normal activities? Yes.    Do you have any questions about your discharge instructions: Diet   No. Medications  No. Follow up visit  No.  Do you have questions or concerns about your Care? No.  Actions: * If pain score is 4 or above: No action needed, pain <4.

## 2018-09-10 ENCOUNTER — Ambulatory Visit: Payer: BLUE CROSS/BLUE SHIELD | Admitting: Gastroenterology

## 2018-09-10 ENCOUNTER — Encounter: Payer: Self-pay | Admitting: Gastroenterology

## 2018-09-10 VITALS — BP 138/74 | HR 86 | Ht 72.0 in | Wt 263.6 lb

## 2018-09-10 DIAGNOSIS — K648 Other hemorrhoids: Secondary | ICD-10-CM | POA: Diagnosis not present

## 2018-09-10 NOTE — Patient Instructions (Signed)
If you are age 58 or older, your body mass index should be between 23-30. Your Body mass index is 35.75 kg/m. If this is out of the aforementioned range listed, please consider follow up with your Primary Care Provider.  If you are age 24 or younger, your body mass index should be between 19-25. Your Body mass index is 35.75 kg/m. If this is out of the aformentioned range listed, please consider follow up with your Primary Care Provider.   HEMORRHOID BANDING PROCEDURE    FOLLOW-UP CARE   1. The procedure you have had should have been relatively painless since the banding of the area involved does not have nerve endings and there is no pain sensation.  The rubber band cuts off the blood supply to the hemorrhoid and the band may fall off as soon as 48 hours after the banding (the band may occasionally be seen in the toilet bowl following a bowel movement). You may notice a temporary feeling of fullness in the rectum which should respond adequately to plain Tylenol or Motrin.  2. Following the banding, avoid strenuous exercise that evening and resume full activity the next day.  A sitz bath (soaking in a warm tub) or bidet is soothing, and can be useful for cleansing the area after bowel movements.     3. To avoid constipation, take two tablespoons of natural wheat bran, natural oat bran, flax, Benefiber or any over the counter fiber supplement and increase your water intake to 7-8 glasses daily.    4. Unless you have been prescribed anorectal medication, do not put anything inside your rectum for two weeks: No suppositories, enemas, fingers, etc.  5. Occasionally, you may have more bleeding than usual after the banding procedure.  This is often from the untreated hemorrhoids rather than the treated one.  Don't be concerned if there is a tablespoon or so of blood.  If there is more blood than this, lie flat with your bottom higher than your head and apply an ice pack to the area. If the bleeding  does not stop within a half an hour or if you feel faint, call our office at [phone] or go to the emergency room.  6. Problems are not common; however, if there is a substantial amount of bleeding, severe pain, chills, fever or difficulty passing urine (very rare) or other problems, you should call us at [phone] or report to the nearest emergency room.  7. Do not stay seated continuously for more than 2-3 hours for a day or two after the procedure.  Tighten your buttock muscles 10-15 times every two hours and take 10-15 deep breaths every 1-2 hours.  Do not spend more than a few minutes on the toilet if you cannot empty your bowel; instead re-visit the toilet at a later time.    It was a pleasure to see you today!  Dr. Loletha Carrow

## 2018-09-10 NOTE — Progress Notes (Signed)
PROCEDURE NOTE: The patient presents with symptomatic grade 2 hemorrhoids, requesting rubber band ligation of his/her hemorrhoidal disease. All risks, benefits and alternative forms of therapy were described and informed consent was obtained.  DRE revealed: prolapsed RP HR   The anorectum was pre-medicated with 0.125% NTG and lubricant. The decision was made to band the RP internal hemorrhoids, and the [place] was used to perform band ligation without complication.Initial band too little tissue - removed and second banding done with good result. Digital anorectal examination was then performed to assure proper positioning of the band, and to adjust the banded tissue as required. The patient was discharged home without pain or other issues. Dietary and behavioral recommendations were given and along with follow-up instructions.   The following adjunctive treatments were recommended:  none  The patient will return 2-3 weeks  for follow-up and possible additional banding as required. No complications were encountered and the patient tolerated the procedure well.

## 2018-09-12 ENCOUNTER — Encounter: Payer: Self-pay | Admitting: Gastroenterology

## 2018-10-02 ENCOUNTER — Ambulatory Visit: Payer: BLUE CROSS/BLUE SHIELD | Admitting: Gastroenterology

## 2018-10-02 ENCOUNTER — Encounter: Payer: Self-pay | Admitting: Gastroenterology

## 2018-10-02 DIAGNOSIS — K648 Other hemorrhoids: Secondary | ICD-10-CM

## 2018-10-02 NOTE — Patient Instructions (Signed)
If you are age 58 or older, your body mass index should be between 23-30. Your Body mass index is 36.62 kg/m. If this is out of the aforementioned range listed, please consider follow up with your Primary Care Provider.  If you are age 76 or younger, your body mass index should be between 19-25. Your Body mass index is 36.62 kg/m. If this is out of the aformentioned range listed, please consider follow up with your Primary Care Provider.   HEMORRHOID BANDING PROCEDURE    FOLLOW-UP CARE   1. The procedure you have had should have been relatively painless since the banding of the area involved does not have nerve endings and there is no pain sensation.  The rubber band cuts off the blood supply to the hemorrhoid and the band may fall off as soon as 48 hours after the banding (the band may occasionally be seen in the toilet bowl following a bowel movement). You may notice a temporary feeling of fullness in the rectum which should respond adequately to plain Tylenol or Motrin.  2. Following the banding, avoid strenuous exercise that evening and resume full activity the next day.  A sitz bath (soaking in a warm tub) or bidet is soothing, and can be useful for cleansing the area after bowel movements.     3. To avoid constipation, take two tablespoons of natural wheat bran, natural oat bran, flax, Benefiber or any over the counter fiber supplement and increase your water intake to 7-8 glasses daily.    4. Unless you have been prescribed anorectal medication, do not put anything inside your rectum for two weeks: No suppositories, enemas, fingers, etc.  5. Occasionally, you may have more bleeding than usual after the banding procedure.  This is often from the untreated hemorrhoids rather than the treated one.  Don't be concerned if there is a tablespoon or so of blood.  If there is more blood than this, lie flat with your bottom higher than your head and apply an ice pack to the area. If the bleeding  does not stop within a half an hour or if you feel faint, call our office at [phone] or go to the emergency room.  6. Problems are not common; however, if there is a substantial amount of bleeding, severe pain, chills, fever or difficulty passing urine (very rare) or other problems, you should call us at [phone] or report to the nearest emergency room.  7. Do not stay seated continuously for more than 2-3 hours for a day or two after the procedure.  Tighten your buttock muscles 10-15 times every two hours and take 10-15 deep breaths every 1-2 hours.  Do not spend more than a few minutes on the toilet if you cannot empty your bowel; instead re-visit the toilet at a later time.    It was a pleasure to see you today!  Dr. Loletha Carrow

## 2018-10-02 NOTE — Progress Notes (Signed)
PROCEDURE NOTE: The patient presents with symptomatic grade 2 hemorrhoids, requesting rubber band ligation of his/her hemorrhoidal disease. All risks, benefits and alternative forms of therapy were described and informed consent was obtained.  DRE revealed: posterior hypertrophied anal papilla   The anorectum was pre-medicated with 0.125% NTG and lubricant. The decision was made to band the RA internal hemorrhoids, and the [place] was used to perform band ligation without complication. Digital anorectal examination was then performed to assure proper positioning of the band, and to adjust the banded tissue as required. The patient was discharged home without pain or other issues. Dietary and behavioral recommendations were given and along with follow-up instructions.   The following adjunctive treatments were recommended:  none  The patient will return 3 weeks  for follow-up and possible additional banding as required. No complications were encountered and the patient tolerated the procedure well.

## 2018-10-29 ENCOUNTER — Encounter: Payer: BLUE CROSS/BLUE SHIELD | Admitting: Gastroenterology

## 2018-11-09 ENCOUNTER — Ambulatory Visit: Payer: BLUE CROSS/BLUE SHIELD | Admitting: Gastroenterology

## 2018-11-09 ENCOUNTER — Encounter: Payer: Self-pay | Admitting: Gastroenterology

## 2018-11-09 DIAGNOSIS — K648 Other hemorrhoids: Secondary | ICD-10-CM

## 2018-11-09 NOTE — Progress Notes (Signed)
PROCEDURE NOTE: The patient presents with symptomatic grade 2 hemorrhoids, requesting rubber band ligation of his/her hemorrhoidal disease. All risks, benefits and alternative forms of therapy were described and informed consent was obtained.  DRE revealed: hypertrophied anal papilla as before   The anorectum was pre-medicated with 0.125% NTG and lubricant. The decision was made to band the LL internal hemorrhoids, and the [place] was used to perform band ligation without complication. Digital anorectal examination was then performed to assure proper positioning of the band, and to adjust the banded tissue as required. The patient was discharged home without pain or other issues. Dietary and behavioral recommendations were given and along with follow-up instructions.   The following adjunctive treatments were recommended:  none  The patient will return as needed  for follow-up and possible additional banding as required. No complications were encountered and the patient tolerated the procedure well.

## 2018-11-09 NOTE — Patient Instructions (Signed)
If you are age 59 or older, your body mass index should be between 23-30. Your Body mass index is 36.08 kg/m. If this is out of the aforementioned range listed, please consider follow up with your Primary Care Provider.  If you are age 43 or younger, your body mass index should be between 19-25. Your Body mass index is 36.08 kg/m. If this is out of the aformentioned range listed, please consider follow up with your Primary Care Provider.   It was a pleasure to see you today!  Dr. Loletha Carrow

## 2020-06-02 ENCOUNTER — Other Ambulatory Visit: Payer: Self-pay | Admitting: Family Medicine

## 2020-06-02 DIAGNOSIS — R0609 Other forms of dyspnea: Secondary | ICD-10-CM

## 2020-06-19 ENCOUNTER — Other Ambulatory Visit (HOSPITAL_COMMUNITY): Payer: BLUE CROSS/BLUE SHIELD

## 2020-07-21 ENCOUNTER — Other Ambulatory Visit: Payer: Self-pay

## 2020-07-21 ENCOUNTER — Ambulatory Visit (HOSPITAL_COMMUNITY): Payer: BC Managed Care – PPO | Attending: Cardiology

## 2020-07-21 DIAGNOSIS — R0600 Dyspnea, unspecified: Secondary | ICD-10-CM | POA: Diagnosis not present

## 2020-07-21 DIAGNOSIS — R0609 Other forms of dyspnea: Secondary | ICD-10-CM

## 2020-07-21 LAB — ECHOCARDIOGRAM COMPLETE
Area-P 1/2: 3.65 cm2
S' Lateral: 2.7 cm

## 2020-11-18 ENCOUNTER — Other Ambulatory Visit (HOSPITAL_COMMUNITY): Payer: Self-pay | Admitting: Internal Medicine

## 2020-11-18 DIAGNOSIS — I5189 Other ill-defined heart diseases: Secondary | ICD-10-CM

## 2020-12-02 ENCOUNTER — Ambulatory Visit (HOSPITAL_COMMUNITY): Payer: BC Managed Care – PPO

## 2020-12-16 ENCOUNTER — Ambulatory Visit (HOSPITAL_COMMUNITY)
Admission: RE | Admit: 2020-12-16 | Discharge: 2020-12-16 | Disposition: A | Discharge status: Home / Self Care | Payer: Self-pay | Source: Ambulatory Visit | Attending: Internal Medicine | Admitting: Internal Medicine

## 2020-12-16 ENCOUNTER — Other Ambulatory Visit: Payer: Self-pay

## 2020-12-16 DIAGNOSIS — I5189 Other ill-defined heart diseases: Secondary | ICD-10-CM | POA: Insufficient documentation

## 2020-12-16 DIAGNOSIS — I700 Atherosclerosis of aorta: Secondary | ICD-10-CM

## 2021-09-06 IMAGING — CT CT CARDIAC CORONARY ARTERY CALCIUM SCORE
2 series · 16 of 20 positions shown, 18 images · non-contrast
Comparison: 05/25/2017 chest radiograph.
COMPARISON: 05/25/2017 chest radiograph.

Addendum:
EXAM:
OVER-READ INTERPRETATION  CT CHEST

The following report is an over-read performed by radiologist Dr.
Polin Billiot [REDACTED] on 12/16/2020. This over-read
does not include interpretation of cardiac or coronary anatomy or
pathology. The coronary calcium score/coronary CTA interpretation by
the cardiologist is attached.
CLINICAL DATA: Risk stratification: 59 Year-old White Male
Coronary Calcium Score
TECHNIQUE: The patient was scanned on a Siemens Force scanner. Axial
non-contrast 3 mm slices were carried out through the heart. The
data set was analyzed on a dedicated work station and scored using
the Agatson method.

[Series 3: cascseq 2.0 b35f 70% · axial · 0.39mm/px · z∈[+1053,+1159]mm · 8 of 69 slices shown]
[im 8/69  vessel]
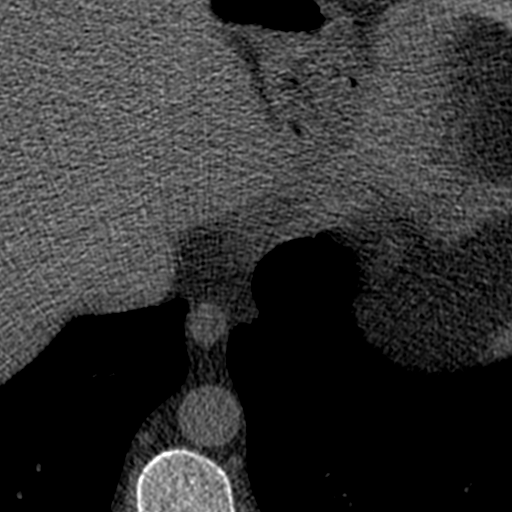
[im 16/69  vessel]
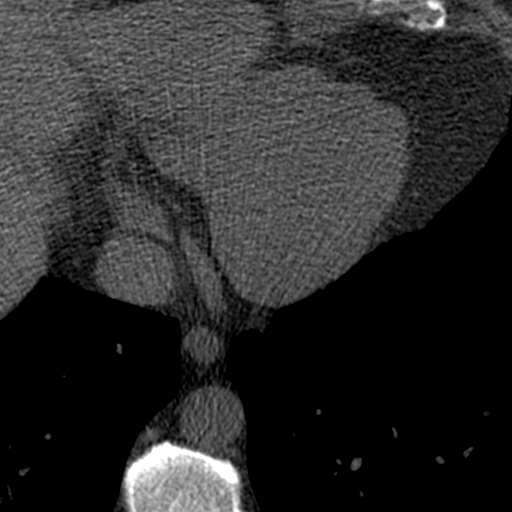
[im 23/69  vessel]
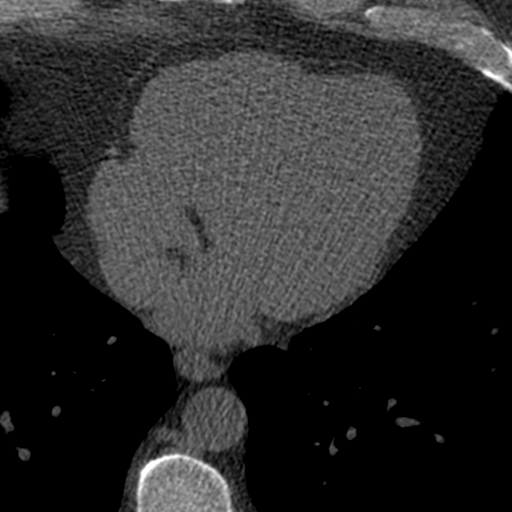
[im 31/69  vessel]
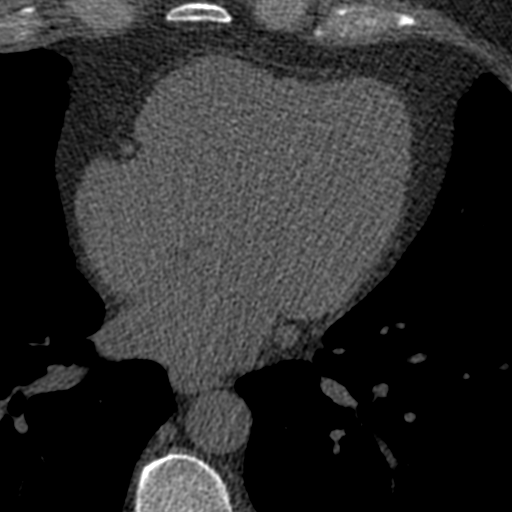
[im 38/69  vessel]
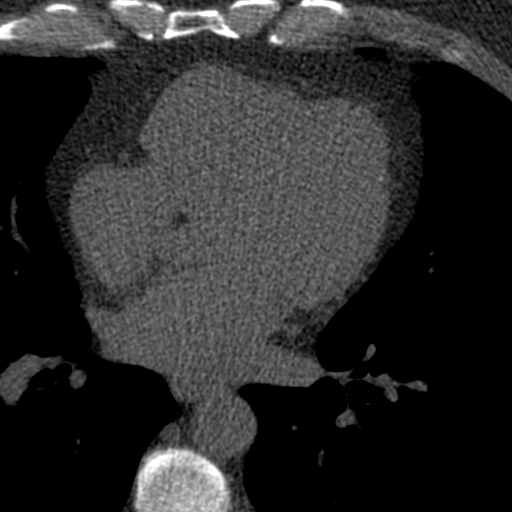
[im 46/69  vessel]
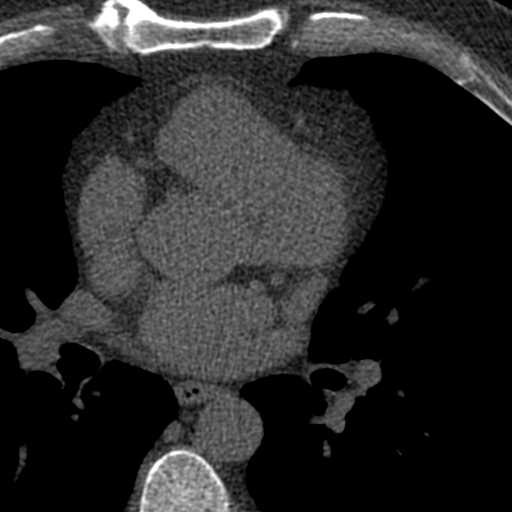
[im 53/69  vessel]
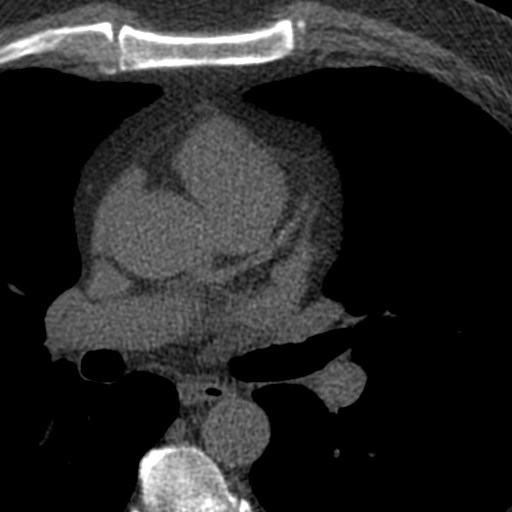
[im 61/69  vessel]
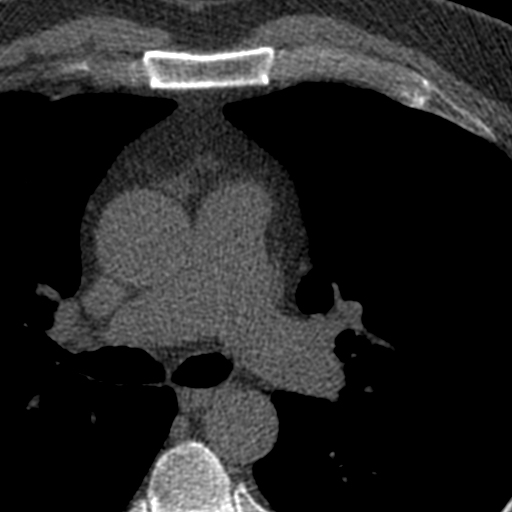

[Series 4: ax st full fov · axial · 0.80mm/px · z∈[+1053,+1159]mm · 8 of 69 slices shown, 10 images]
[im 8/69  vessel]
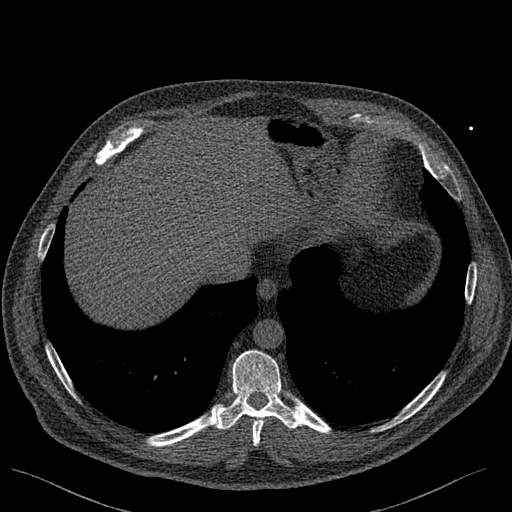
[im 8/69  lung]
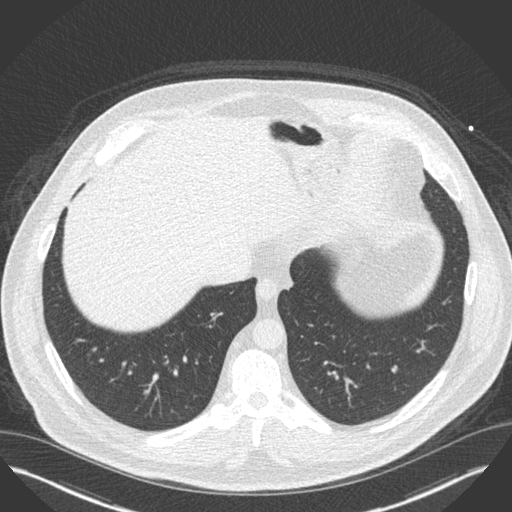
[im 16/69  vessel]
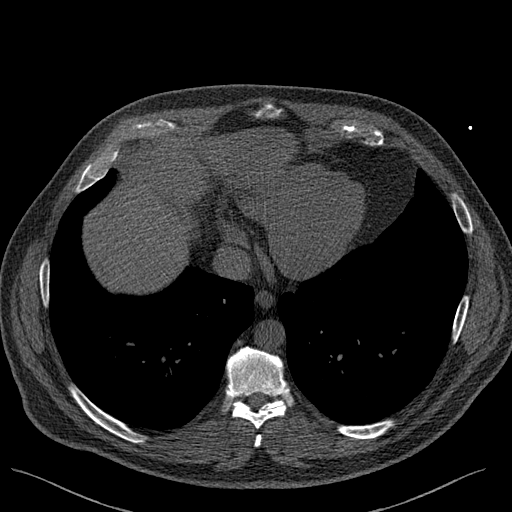
[im 23/69  vessel]
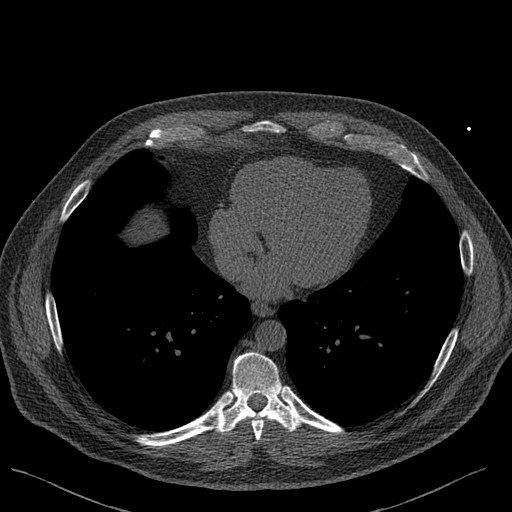
[im 31/69  vessel]
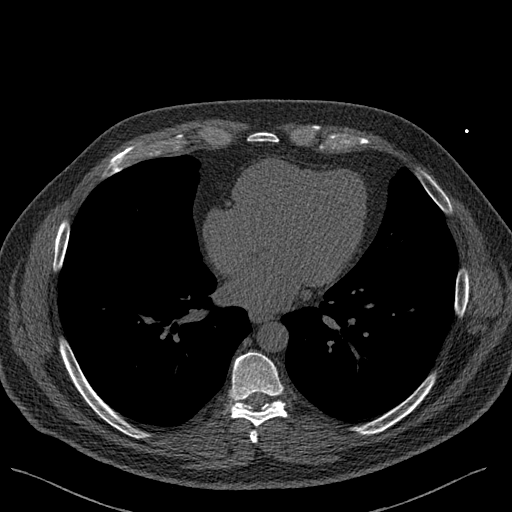
[im 38/69  vessel]
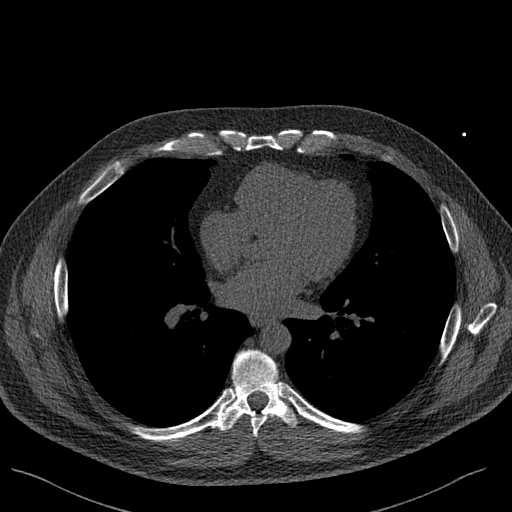
[im 38/69  lung]
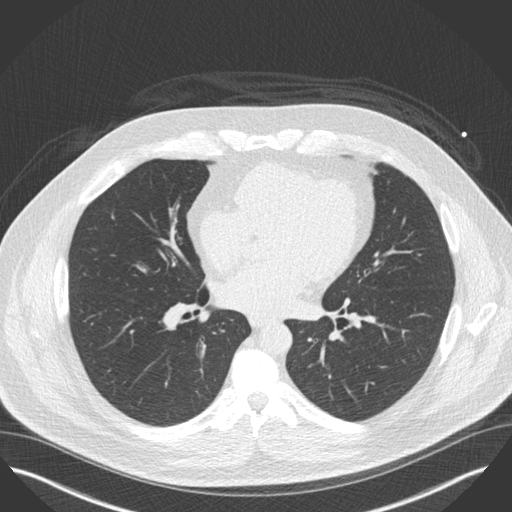
[im 46/69  vessel]
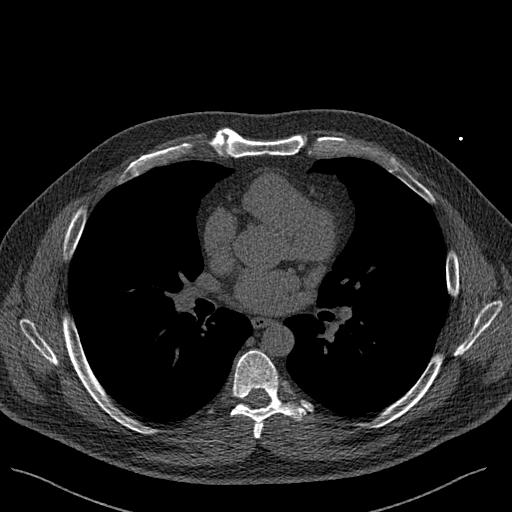
[im 53/69  vessel]
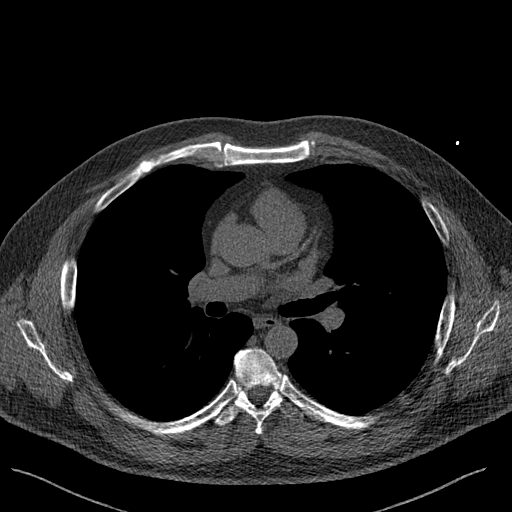
[im 61/69  vessel]
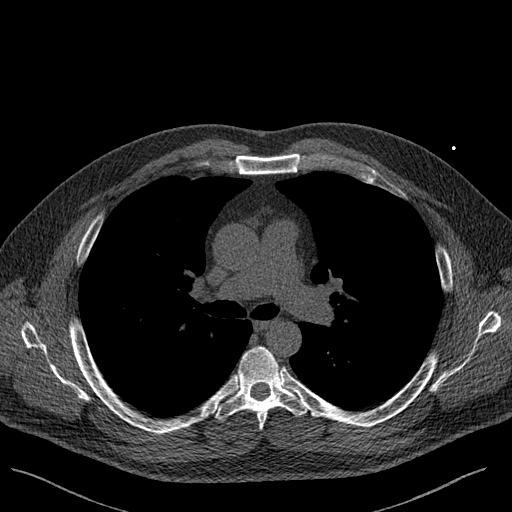

[16 of 20 positions shown; findings below may reference images not displayed]

FINDINGS: Vascular: Aortic atherosclerosis.

Mediastinum/Nodes: No imaged thoracic adenopathy.

Lungs/Pleura: No pleural fluid. Nonspecific mild bronchial wall
thickening.

Upper Abdomen: Normal imaged portions of the liver, spleen, stomach.

Musculoskeletal: No acute osseous abnormality.
IMPRESSION: 1.  No acute findings in the imaged extracardiac chest.
2.  Aortic Atherosclerosis (N5L13-R5D.D).
FINDINGS: Non-cardiac: See separate report from [REDACTED].

Ascending Aorta: Normal caliber.

Aortic arch atherosclerosis noted.

Pericardium: Normal.

Coronary arteries: Normal origins.

Coronary calcium score of 7. This was 37th percentile for age,
gender, and race matched controls.
IMPRESSION: 1. Coronary calcium score of 7. This was 37th percentile for age,
gender, and race matched controls.

2. Aortic arch atherosclerosis noted.

*** End of Addendum ***
EXAM:
OVER-READ INTERPRETATION  CT CHEST

The following report is an over-read performed by radiologist Dr.
Polin Billiot [REDACTED] on 12/16/2020. This over-read
does not include interpretation of cardiac or coronary anatomy or
pathology. The coronary calcium score/coronary CTA interpretation by
the cardiologist is attached.
FINDINGS: Vascular: Aortic atherosclerosis.

Mediastinum/Nodes: No imaged thoracic adenopathy.

Lungs/Pleura: No pleural fluid. Nonspecific mild bronchial wall
thickening.

Upper Abdomen: Normal imaged portions of the liver, spleen, stomach.

Musculoskeletal: No acute osseous abnormality.
IMPRESSION: 1.  No acute findings in the imaged extracardiac chest.
2.  Aortic Atherosclerosis (N5L13-R5D.D).

## 2023-09-06 ENCOUNTER — Encounter: Payer: Self-pay | Admitting: Gastroenterology

## 2023-12-11 ENCOUNTER — Encounter: Payer: Self-pay | Admitting: Gastroenterology

## 2024-01-08 ENCOUNTER — Ambulatory Visit (AMBULATORY_SURGERY_CENTER): Payer: 59

## 2024-01-08 VITALS — Ht 72.0 in | Wt 245.0 lb

## 2024-01-08 DIAGNOSIS — Z860100 Personal history of colon polyps, unspecified: Secondary | ICD-10-CM

## 2024-01-08 MED ORDER — NA SULFATE-K SULFATE-MG SULF 17.5-3.13-1.6 GM/177ML PO SOLN: 1.0000 | Freq: Once | ORAL | 0 refills | Status: AC

## 2024-01-08 NOTE — Progress Notes (Signed)

## 2024-01-08 NOTE — Patient Instructions (Signed)
 [facility] has implemented a new process for scheduling procedures.  Please note your arrival time for the [facility] is your appointment time that is shown on your written instructions.  Please do not arrive one hour prior to the time listed in your instructions.  Please ignore any outside notifications to arrive one hour early.  We apologize for any confusion and look forward to seeing you for your procedure.

## 2024-01-30 ENCOUNTER — Encounter: Payer: Self-pay | Admitting: Gastroenterology

## 2024-02-08 ENCOUNTER — Other Ambulatory Visit: Payer: Self-pay | Admitting: Medical Genetics

## 2024-02-14 ENCOUNTER — Other Ambulatory Visit (HOSPITAL_COMMUNITY)
Admission: RE | Admit: 2024-02-14 | Discharge: 2024-02-14 | Disposition: A | Discharge status: Home / Self Care | Payer: Self-pay | Source: Ambulatory Visit | Attending: Medical Genetics | Admitting: Medical Genetics

## 2024-02-16 ENCOUNTER — Encounter: Payer: Self-pay | Admitting: Gastroenterology

## 2024-02-23 LAB — GENECONNECT MOLECULAR SCREEN: Genetic Analysis Overall Interpretation: NEGATIVE

## 2024-02-27 ENCOUNTER — Encounter: Payer: Self-pay | Admitting: Gastroenterology

## 2024-05-03 ENCOUNTER — Encounter (HOSPITAL_COMMUNITY): Payer: Self-pay

## 2024-05-03 ENCOUNTER — Other Ambulatory Visit: Payer: Self-pay

## 2024-05-03 ENCOUNTER — Emergency Department (HOSPITAL_COMMUNITY)
Admission: EM | Admit: 2024-05-03 | Discharge: 2024-05-04 | Disposition: A | Discharge status: Home / Self Care | Attending: Emergency Medicine | Admitting: Emergency Medicine

## 2024-05-03 DIAGNOSIS — T63091A Toxic effect of venom of other snake, accidental (unintentional), initial encounter: Secondary | ICD-10-CM | POA: Diagnosis present

## 2024-05-03 DIAGNOSIS — E876 Hypokalemia: Secondary | ICD-10-CM | POA: Insufficient documentation

## 2024-05-03 DIAGNOSIS — Z7982 Long term (current) use of aspirin: Secondary | ICD-10-CM | POA: Diagnosis not present

## 2024-05-03 DIAGNOSIS — Z79899 Other long term (current) drug therapy: Secondary | ICD-10-CM | POA: Insufficient documentation

## 2024-05-03 DIAGNOSIS — I10 Essential (primary) hypertension: Secondary | ICD-10-CM | POA: Diagnosis not present

## 2024-05-03 DIAGNOSIS — W5911XA Bitten by nonvenomous snake, initial encounter: Secondary | ICD-10-CM

## 2024-05-03 LAB — CBC WITH DIFFERENTIAL/PLATELET
Abs Immature Granulocytes: 0.02 K/uL (ref 0.00–0.07)
Basophils Absolute: 0.1 K/uL (ref 0.0–0.1)
Basophils Relative: 1 %
Eosinophils Absolute: 0.1 K/uL (ref 0.0–0.5)
Eosinophils Relative: 2 %
HCT: 42 % (ref 39.0–52.0)
Hemoglobin: 14.9 g/dL (ref 13.0–17.0)
Immature Granulocytes: 0 %
Lymphocytes Relative: 31 %
Lymphs Abs: 2 K/uL (ref 0.7–4.0)
MCH: 30.3 pg (ref 26.0–34.0)
MCHC: 35.5 g/dL (ref 30.0–36.0)
MCV: 85.5 fL (ref 80.0–100.0)
Monocytes Absolute: 0.8 K/uL (ref 0.1–1.0)
Monocytes Relative: 12 %
Neutro Abs: 3.5 K/uL (ref 1.7–7.7)
Neutrophils Relative %: 54 %
Platelets: 206 K/uL (ref 150–400)
RBC: 4.91 MIL/uL (ref 4.22–5.81)
RDW: 12.9 % (ref 11.5–15.5)
WBC: 6.5 K/uL (ref 4.0–10.5)
nRBC: 0 % (ref 0.0–0.2)

## 2024-05-03 LAB — PROTIME-INR
INR: 1 (ref 0.8–1.2)
Prothrombin Time: 14.2 s (ref 11.4–15.2)

## 2024-05-03 LAB — BASIC METABOLIC PANEL WITH GFR
Anion gap: 11 (ref 5–15)
BUN: 29 mg/dL — ABNORMAL HIGH (ref 8–23)
CO2: 25 mmol/L (ref 22–32)
Calcium: 9.4 mg/dL (ref 8.9–10.3)
Chloride: 102 mmol/L (ref 98–111)
Creatinine, Ser: 0.73 mg/dL (ref 0.61–1.24)
GFR, Estimated: >60 mL/min (ref >60)
Glucose, Bld: 91 mg/dL (ref 70–99)
Potassium: 2.9 mmol/L — ABNORMAL LOW (ref 3.5–5.1)
Sodium: 138 mmol/L (ref 135–145)

## 2024-05-03 LAB — FIBRINOGEN: Fibrinogen: 236 mg/dL (ref 210–475)

## 2024-05-03 MED ORDER — OXYCODONE-ACETAMINOPHEN 5-325 MG PO TABS: 1.0000 | ORAL_TABLET | Freq: Four times a day (QID) | ORAL | 0 refills | Status: AC | PRN

## 2024-05-03 MED ORDER — OXYCODONE-ACETAMINOPHEN 5-325 MG PO TABS
1.0000 | ORAL_TABLET | Freq: Once | ORAL | Status: AC
  Administered 2024-05-03: 1 via ORAL
  Filled 2024-05-03: qty 1

## 2024-05-03 MED ORDER — POTASSIUM CHLORIDE CRYS ER 20 MEQ PO TBCR: 20.0000 meq | EXTENDED_RELEASE_TABLET | Freq: Two times a day (BID) | ORAL | 0 refills | Status: AC

## 2024-05-03 NOTE — ED Provider Notes (Signed)
 [place] EMERGENCY DEPARTMENT AT [facility] Provider Note   CSN: [id number] Arrival date & time: 05/03/24  1818     Patient presents with: Snake Bite (Copperhead)   David Fisher is a 63 y.o. male.   Patient to ED with copperhead snake bite to left index finger 1/2 hour prior to arrival (5:50 pm). He reports single bite with subsequent swelling of the finger, feels like a wasp sting. No nausea, dizziness, numbness of the hand or arm (left).   The history is provided by the patient. No language interpreter was used.       Prior to Admission medications   Medication Sig Start Date End Date Taking? Authorizing Provider  oxyCODONE -acetaminophen  (PERCOCET/ROXICET) 5-325 MG tablet Take 1 tablet by mouth every 6 (six) hours as needed for severe pain (pain score 7-10). 05/03/24  Yes Kye Silverstein, Margit, PA-C  potassium chloride  SA (KLOR-CON  M) 20 MEQ tablet Take 1 tablet (20 mEq total) by mouth 2 (two) times daily. 05/03/24  Yes Larsen Zettel, Margit, PA-C  aspirin EC 81 MG tablet Take 81 mg by mouth daily.    [provider]  atorvastatin (LIPITOR) 10 MG tablet Take 10 mg by mouth daily. 12/11/23   [provider]  cetirizine (ZYRTEC) 10 MG tablet Take 10 mg by mouth daily.    [provider]  hydrochlorothiazide (HYDRODIURIL) 25 MG tablet Take 25 mg by mouth daily.    [provider]  HYDROcodone -acetaminophen  (NORCO/VICODIN) 5-325 MG tablet Take 1 tablet by mouth every 4 (four) hours as needed. Patient not taking: Reported on 01/08/2024    [provider]  ibuprofen (ADVIL) 400 MG tablet Take 400 mg by mouth every 6 (six) hours as needed.    [provider]  losartan (COZAAR) 25 MG tablet Take 25 mg by mouth daily. 12/11/23   [provider]  meloxicam (MOBIC) 7.5 MG tablet Take 7.5 mg by mouth daily as needed. 01/01/24   [provider]  Multiple Vitamin (MULTIVITAMIN) tablet Take 1 tablet by mouth daily.    [provider]    Allergies: Patient has no known allergies.    Review of Systems  Updated Vital Signs BP (!) 166/88   Pulse 64   Temp 97.6 F (36.4 C) (Oral)   Resp 14   Ht 6' (1.829 m)   Wt 111.1 kg   SpO2 97%   BMI 33.23 kg/m   Physical Exam Vitals and nursing note reviewed.  Constitutional:      Appearance: He is well-developed.  Pulmonary:     Effort: Pulmonary effort is normal.  Musculoskeletal:        General: Normal range of motion.     Cervical back: Normal range of motion.     Comments: Left index finger is moderately swollen. Two small puncture wounds to dorsal and distal finger. One puncture is minimal without surrounding discoloration, the second has bruising. The dorsum of the finger overlying the middle phalanx is pale. Cap RF distally is WNL, <2s. Swelling extends to base of finger circumferentially, but does not extend to the MCP or below.   Skin:    General: Skin is warm and dry.  Neurological:     Mental Status: He is alert and oriented to person, place, and time.     Sensory: No sensory deficit.     (all labs ordered are listed, but only abnormal results are displayed) Labs Reviewed  BASIC METABOLIC PANEL WITH GFR - Abnormal; Notable for the following  components:      Result Value   Potassium 2.9 (*)    BUN 29 (*)    All other components within normal limits  CBC WITH DIFFERENTIAL/PLATELET  PROTIME-INR  FIBRINOGEN     EKG: None  Radiology: No results found.   Procedures   Medications Ordered in the ED  oxyCODONE -acetaminophen  (PERCOCET/ROXICET) 5-325 MG per tablet 1 tablet (has no administration in time range)  oxyCODONE -acetaminophen  (PERCOCET/ROXICET) 5-325 MG per tablet 1 tablet (1 tablet Oral Given 05/03/24 1936)                                    Medical Decision Making This patient presents to the ED for concern of snake bite, this involves an extensive number of treatment options, and is a complaint that carries with it a  high risk of complications and morbidity.  The differential diagnosis includes range of envenomation that may require antivenom   Co morbidities that complicate the patient evaluation  HTN   Additional history obtained:  Additional history and/or information obtained from chart review, notable for n/a   Lab Tests:  I Ordered, and personally interpreted labs.  The pertinent results include:       Imaging Studies ordered:  I ordered imaging studies including na/ I independently visualized and interpreted imaging which showed n/a I agree with the radiologist interpretation   Cardiac Monitoring:  The patient was maintained on a cardiac monitor.  I personally viewed and interpreted the cardiac monitored which showed an underlying rhythm of: n/a   Medicines ordered and prescription drug management:  I ordered medication including Percocet  for pain Reevaluation of the patient after these medicines showed that the patient improved I have reviewed the patients home medicines and have made adjustments as needed   Test Considered:  N/a   Critical Interventions:  N/a   Consultations Obtained:  I requested consultation with Poison Control,  and discussed lab and imaging findings as well as pertinent plan - they recommend:  Serial measurements of the finger hand and wrist per protocol provided Observation for 6 hours after time of bite Crofab if swelling extends past the hand to wrist.  If Crofab needed, give 6 vials. Observe for additional 2 hours. If swelling is arrested with Crofab, he can safely discharge to home.    Problem List / ED Course:  Here with snake bite to the left index finger with swelling within the first 30 minutes.  Poison Control consulted: - Update tetanus if needed - Elevate the arm  - take measurements of the finger, hand wrist and forearm twice every hour x 3 hours, then once q hour for 3 hours. If swelling extends past the wrist, anticipate  Crofab administration  Patient's swelling is monitored as above. No swelling that extends more proximally than the MCP joint of the index finger. No redness into the hand. No wrist tenderness. Labs reviewed. Hypokalemic to 2.9 - will supplement. He is felt appropriate for discharge home. Poison Control to contact the patient tomorrow for follow up.    Reevaluation:  After the interventions noted above, I reevaluated the patient and found that they have :improved   Social Determinants of Health:  Never a smoker   Disposition:  After consideration of the diagnostic results and the patients response to treatment, I feel that the patient would benefit from discharge home.   Amount and/or Complexity of Data Reviewed Labs: ordered.  Risk Prescription drug management.        Final diagnoses:  Snake bite, initial encounter  Hypokalemia    ED Discharge Orders          Ordered    oxyCODONE -acetaminophen  (PERCOCET/ROXICET) 5-325 MG tablet  Every 6 hours PRN        05/03/24 2340    potassium chloride  SA (KLOR-CON  M) 20 MEQ tablet  2 times daily        05/03/24 2341               Odell Balls, PA-C 05/03/24 2350    Lenor Hollering, MD 05/06/24 1514

## 2024-05-03 NOTE — ED Notes (Signed)
 Hand 24.5 cm Wrist 19.3 cm  Forearm 19 cm Finger 8 cm

## 2024-05-03 NOTE — ED Triage Notes (Signed)
 Pt presents to the ED c/o snake bite on left index finger. [place] snake was on the patio and strike at his hand. Pt stated he had on gardening gloves. Two fangs insertion noted. Finger marked with out line of swelling and time. Pt stated he is having numbness and throbbing. Swelling and hematoma noted.

## 2024-05-03 NOTE — Discharge Instructions (Addendum)
 As we discussed, Poison Control will contact you at home in follow up of the snake bite. Take the potassium supplement as prescribed for low potassium.   If you have any worsening pain or swelling, which at this point is unlikely, return to the ED for further evaluation. Percocet for pain as prescribed.

## 2024-05-04 NOTE — ED Notes (Signed)
 Hand 24.5 cm Wrist 19.3 cm  Forearm 19 cm Finger 8 cm
